# Patient Record
Sex: Male | Born: 1995 | Race: Black or African American | Hispanic: No | Marital: Married | Smoking: Current every day smoker
Health system: Southern US, Community
[De-identification: ages and names within clinical notes are randomized; demographics above are authoritative.]

---

## 2017-11-28 ENCOUNTER — Encounter (HOSPITAL_BASED_OUTPATIENT_CLINIC_OR_DEPARTMENT_OTHER): Payer: Self-pay | Admitting: *Deleted

## 2017-11-28 ENCOUNTER — Emergency Department (HOSPITAL_BASED_OUTPATIENT_CLINIC_OR_DEPARTMENT_OTHER)
Admission: EM | Admit: 2017-11-28 | Discharge: 2017-11-28 | Disposition: A | Discharge status: Home / Self Care | Payer: Self-pay | Attending: Emergency Medicine | Admitting: Emergency Medicine

## 2017-11-28 ENCOUNTER — Emergency Department (HOSPITAL_BASED_OUTPATIENT_CLINIC_OR_DEPARTMENT_OTHER): Payer: Self-pay

## 2017-11-28 ENCOUNTER — Other Ambulatory Visit: Payer: Self-pay

## 2017-11-28 DIAGNOSIS — R112 Nausea with vomiting, unspecified: Secondary | ICD-10-CM

## 2017-11-28 DIAGNOSIS — R21 Rash and other nonspecific skin eruption: Secondary | ICD-10-CM | POA: Insufficient documentation

## 2017-11-28 DIAGNOSIS — R05 Cough: Secondary | ICD-10-CM | POA: Insufficient documentation

## 2017-11-28 DIAGNOSIS — R110 Nausea: Secondary | ICD-10-CM | POA: Insufficient documentation

## 2017-11-28 DIAGNOSIS — R509 Fever, unspecified: Secondary | ICD-10-CM | POA: Insufficient documentation

## 2017-11-28 DIAGNOSIS — F17200 Nicotine dependence, unspecified, uncomplicated: Secondary | ICD-10-CM | POA: Insufficient documentation

## 2017-11-28 LAB — COMPREHENSIVE METABOLIC PANEL
ALT: 35 U/L (ref 17–63)
AST: 29 U/L (ref 15–41)
Albumin: 4.6 g/dL (ref 3.5–5.0)
Alkaline Phosphatase: 78 U/L (ref 38–126)
Anion gap: 10 (ref 5–15)
BILIRUBIN TOTAL: 0.4 mg/dL (ref 0.3–1.2)
BUN: 15 mg/dL (ref 6–20)
CHLORIDE: 102 mmol/L (ref 101–111)
CO2: 25 mmol/L (ref 22–32)
Calcium: 9.4 mg/dL (ref 8.9–10.3)
Creatinine, Ser: 1.06 mg/dL (ref 0.61–1.24)
Glucose, Bld: 95 mg/dL (ref 65–99)
POTASSIUM: 4.1 mmol/L (ref 3.5–5.1)
Sodium: 137 mmol/L (ref 135–145)
TOTAL PROTEIN: 7.9 g/dL (ref 6.5–8.1)

## 2017-11-28 LAB — CBC WITH DIFFERENTIAL/PLATELET
BASOS ABS: 0 10*3/uL (ref 0.0–0.1)
BASOS PCT: 0 %
EOS PCT: 0 %
Eosinophils Absolute: 0 10*3/uL (ref 0.0–0.7)
HEMATOCRIT: 43.6 % (ref 39.0–52.0)
Hemoglobin: 15.2 g/dL (ref 13.0–17.0)
Lymphocytes Relative: 2 %
Lymphs Abs: 0.2 10*3/uL — ABNORMAL LOW (ref 0.7–4.0)
MCH: 29.7 pg (ref 26.0–34.0)
MCHC: 34.9 g/dL (ref 30.0–36.0)
MCV: 85.3 fL (ref 78.0–100.0)
MONO ABS: 0.9 10*3/uL (ref 0.1–1.0)
Monocytes Relative: 10 %
NEUTROS ABS: 7.6 10*3/uL (ref 1.7–7.7)
Neutrophils Relative %: 88 %
PLATELETS: 163 10*3/uL (ref 150–400)
RBC: 5.11 MIL/uL (ref 4.22–5.81)
RDW: 11.7 % (ref 11.5–15.5)
WBC: 8.7 10*3/uL (ref 4.0–10.5)

## 2017-11-28 LAB — INFLUENZA PANEL BY PCR (TYPE A & B)
INFLBPCR: NEGATIVE
Influenza A By PCR: POSITIVE — AB

## 2017-11-28 LAB — LIPASE, BLOOD: LIPASE: 81 U/L — AB (ref 11–51)

## 2017-11-28 MED ORDER — FAMOTIDINE 40 MG PO TABS: 40.0000 mg | ORAL_TABLET | Freq: Every day | ORAL | 0 refills | Status: AC

## 2017-11-28 MED ORDER — KETOROLAC TROMETHAMINE 30 MG/ML IJ SOLN
30.0000 mg | Freq: Once | INTRAMUSCULAR | Status: AC
  Administered 2017-11-28: 30 mg via INTRAVENOUS
  Filled 2017-11-28: qty 1

## 2017-11-28 MED ORDER — SODIUM CHLORIDE 0.9 % IV BOLUS (SEPSIS)
1000.0000 mL | Freq: Once | INTRAVENOUS | Status: AC
  Administered 2017-11-28: 1000 mL via INTRAVENOUS

## 2017-11-28 MED ORDER — ONDANSETRON HCL 4 MG/2ML IJ SOLN
4.0000 mg | Freq: Once | INTRAMUSCULAR | Status: AC
  Administered 2017-11-28: 4 mg via INTRAVENOUS
  Filled 2017-11-28: qty 2

## 2017-11-28 MED ORDER — ACETAMINOPHEN 325 MG PO TABS
650.0000 mg | ORAL_TABLET | Freq: Once | ORAL | Status: AC
  Administered 2017-11-28: 650 mg via ORAL
  Filled 2017-11-28: qty 2

## 2017-11-28 MED ORDER — ONDANSETRON HCL 4 MG PO TABS: 4.0000 mg | ORAL_TABLET | Freq: Three times a day (TID) | ORAL | 0 refills | Status: AC | PRN

## 2017-11-28 NOTE — ED Triage Notes (Signed)
Pt reports cough, sore throat and vomiting "with bright red blood in it" x last night with chills and hot flashes, denies any diarrhea, subjective temps also.

## 2017-11-28 NOTE — Discharge Instructions (Addendum)
Continue to take Tylenol for fever.  We are prescribing you an acid medicine and antinausea medicine to help with the other symptoms.  You should return to the hospital if you have any worsening symptoms.  We are also giving you the number for a medical clinic to see to establish a primary care doctor.

## 2017-11-28 NOTE — ED Provider Notes (Signed)
[facility] EMERGENCY DEPARTMENT Provider Note   CSN: [id number] Arrival date & time: 11/28/17  0800     History   Chief Complaint Chief Complaint  Patient presents with  . Cough    HPI Colton Chavez is a 22 y.o. male.  HPI  22 year old male with no significant past medical history complaining of being sick since yesterday.  He states he started with headache last evening followed by sore throat and then multiple episodes of vomiting which then involve some streaks of red blood in the vomitus.  He now still has some burning chest pain worse with taking a deep breath associated with some cough.  He also noticed a rash across his face after the vomit he was not sure about a fever but did say that he was hot and cold last night and does have a fever on presentation here today.  He did state that 2 years ago he had an episode of intractable vomiting that required him to need to be admitted to the hospital where they treated him with nausea medicine and he needed to stay in the hospital for about 7 days.  He has prior been on antacids but is taking no medications right now.  His daughter was also sick yesterday but he states he was already ill prior to seeing her.  He denies any recent travel or unusual exposures.  History reviewed. No pertinent past medical history.  There are no active problems to display for this patient.   History reviewed. No pertinent surgical history.     Home Medications    Prior to Admission medications   Not on File    Family History History reviewed. No pertinent family history.  Social History Social History   Tobacco Use  . Smoking status: Current Every Day Smoker  . Smokeless tobacco: Never Used  Substance Use Topics  . Alcohol use: Not on file  . Drug use: Not on file     Allergies   Patient has no known allergies.   Review of Systems Review of Systems  Constitutional: Positive for chills and fever.  HENT: Positive for  sore throat.   Eyes: Negative for visual disturbance.  Respiratory: Positive for cough.   Cardiovascular: Positive for chest pain.  Gastrointestinal: Positive for abdominal pain and vomiting. Negative for anal bleeding, constipation and diarrhea.  Genitourinary: Negative for discharge, dysuria and flank pain.  Musculoskeletal: Negative for back pain and myalgias.  Neurological: Positive for headaches. Negative for seizures and numbness.  Hematological: Negative for adenopathy.     Physical Exam Updated Vital Signs BP 130/77 (BP Location: Right Arm)   Pulse 74   Temp (!) 100.4 F (38 C) (Oral)   Resp 18   Ht 5\' 7"  (1.702 m)   Wt 56.7 kg (125 lb)   SpO2 100%   BMI 19.58 kg/m   Physical Exam  Constitutional: He appears well-developed and well-nourished.  HENT:  Head: Normocephalic and atraumatic.  Right Ear: External ear normal.  Left Ear: External ear normal.  Mouth/Throat: Oropharynx is clear and moist. No oropharyngeal exudate.  Eyes: Conjunctivae are normal. Right eye exhibits no discharge. Left eye exhibits no discharge.  Neck: Neck supple.  Cardiovascular: Normal rate and regular rhythm.  No murmur heard. Pulmonary/Chest: Effort normal and breath sounds normal. No stridor. No respiratory distress. He has no wheezes.  Abdominal: Soft. He exhibits no mass. There is no tenderness. There is no rebound and no guarding.  Musculoskeletal: Normal range of motion.  He exhibits no edema.  Lymphadenopathy:    He has no cervical adenopathy.  Neurological: He is alert.  Skin: Skin is warm and dry. Rash (petechial rash around eyes and cheeks) noted.  Psychiatric: He has a normal mood and affect.  Nursing note and vitals reviewed.    ED Treatments / Results  Labs (all labs ordered are listed, but only abnormal results are displayed) Labs Reviewed  LIPASE, BLOOD - Abnormal; Notable for the following components:      Result Value   Lipase 81 (*)    All other components within  normal limits  CBC WITH DIFFERENTIAL/PLATELET - Abnormal; Notable for the following components:   Lymphs Abs 0.2 (*)    All other components within normal limits  INFLUENZA PANEL BY PCR (TYPE A & B) - Abnormal; Notable for the following components:   Influenza A By PCR POSITIVE (*)    All other components within normal limits  COMPREHENSIVE METABOLIC PANEL    EKG  EKG Interpretation None       Radiology Dg Chest 2 View  Result Date: 11/28/2017 CLINICAL DATA:  Cough and fever with vomiting EXAM: CHEST  2 VIEW COMPARISON:  None. FINDINGS: Lungs are clear. Heart size and pulmonary vascularity are normal. No adenopathy. No bone lesions. IMPRESSION: No edema or consolidation. Electronically Signed   By: Bretta BangWilliam  Woodruff III M.D.   On: 11/28/2017 08:45    Procedures Procedures (including critical care time)  Medications Ordered in ED Medications  sodium chloride 0.9 % bolus 1,000 mL (not administered)  ondansetron (ZOFRAN) injection 4 mg (not administered)  acetaminophen (TYLENOL) tablet 650 mg (not administered)     Initial Impression / Assessment and Plan / ED Course  I have reviewed the triage vital signs and the nursing notes.  Pertinent labs & imaging results that were available during my care of the patient were reviewed by me and considered in my medical decision making (see chart for details).  Clinical Course as of Nov 29 1535  Tue Nov 28, 2017  1001 Reevaluated patient.  Pain is improved to 5 out of 10 and he has been tolerating some light.  Try dose of Toradol.  His lab work was benign other than a lipase of 81 and his chest x-ray was negative.  His flu is still pending but will not result during this stay  [MB]    Clinical Course User Index [MB] Terrilee FilesButler, Damariz Paganelli C, MD    This 22 year old healthy male here with a febrile illness involving chest pain abdominal pain sore throat headache.  This possibly a viral syndrome but he also may have experienced Mallory-Weiss  tear previous pneumomediastinum so we need to get a chest x-ray.  He is also getting some basic labs in case there is an intra-abdominal process.  Symptomatically were giving him IV fluids and nausea medicine some Tylenol for his fever.  His medications are in and his lab work and chest x-ray was resulted.  Final Clinical Impressions(s) / ED Diagnoses   Final diagnoses:  Non-intractable vomiting with nausea, unspecified vomiting type  Febrile illness, acute    ED Discharge Orders    None       Terrilee FilesButler, Jerusalem Brownstein C, MD 11/28/17 1537

## 2019-05-10 IMAGING — CR DG CHEST 2V
2 series · 2 of 2 positions shown · non-contrast
Comparison: None.

CLINICAL DATA: Cough and fever with vomiting

EXAM:
CHEST  2 VIEW

[w chest pa]
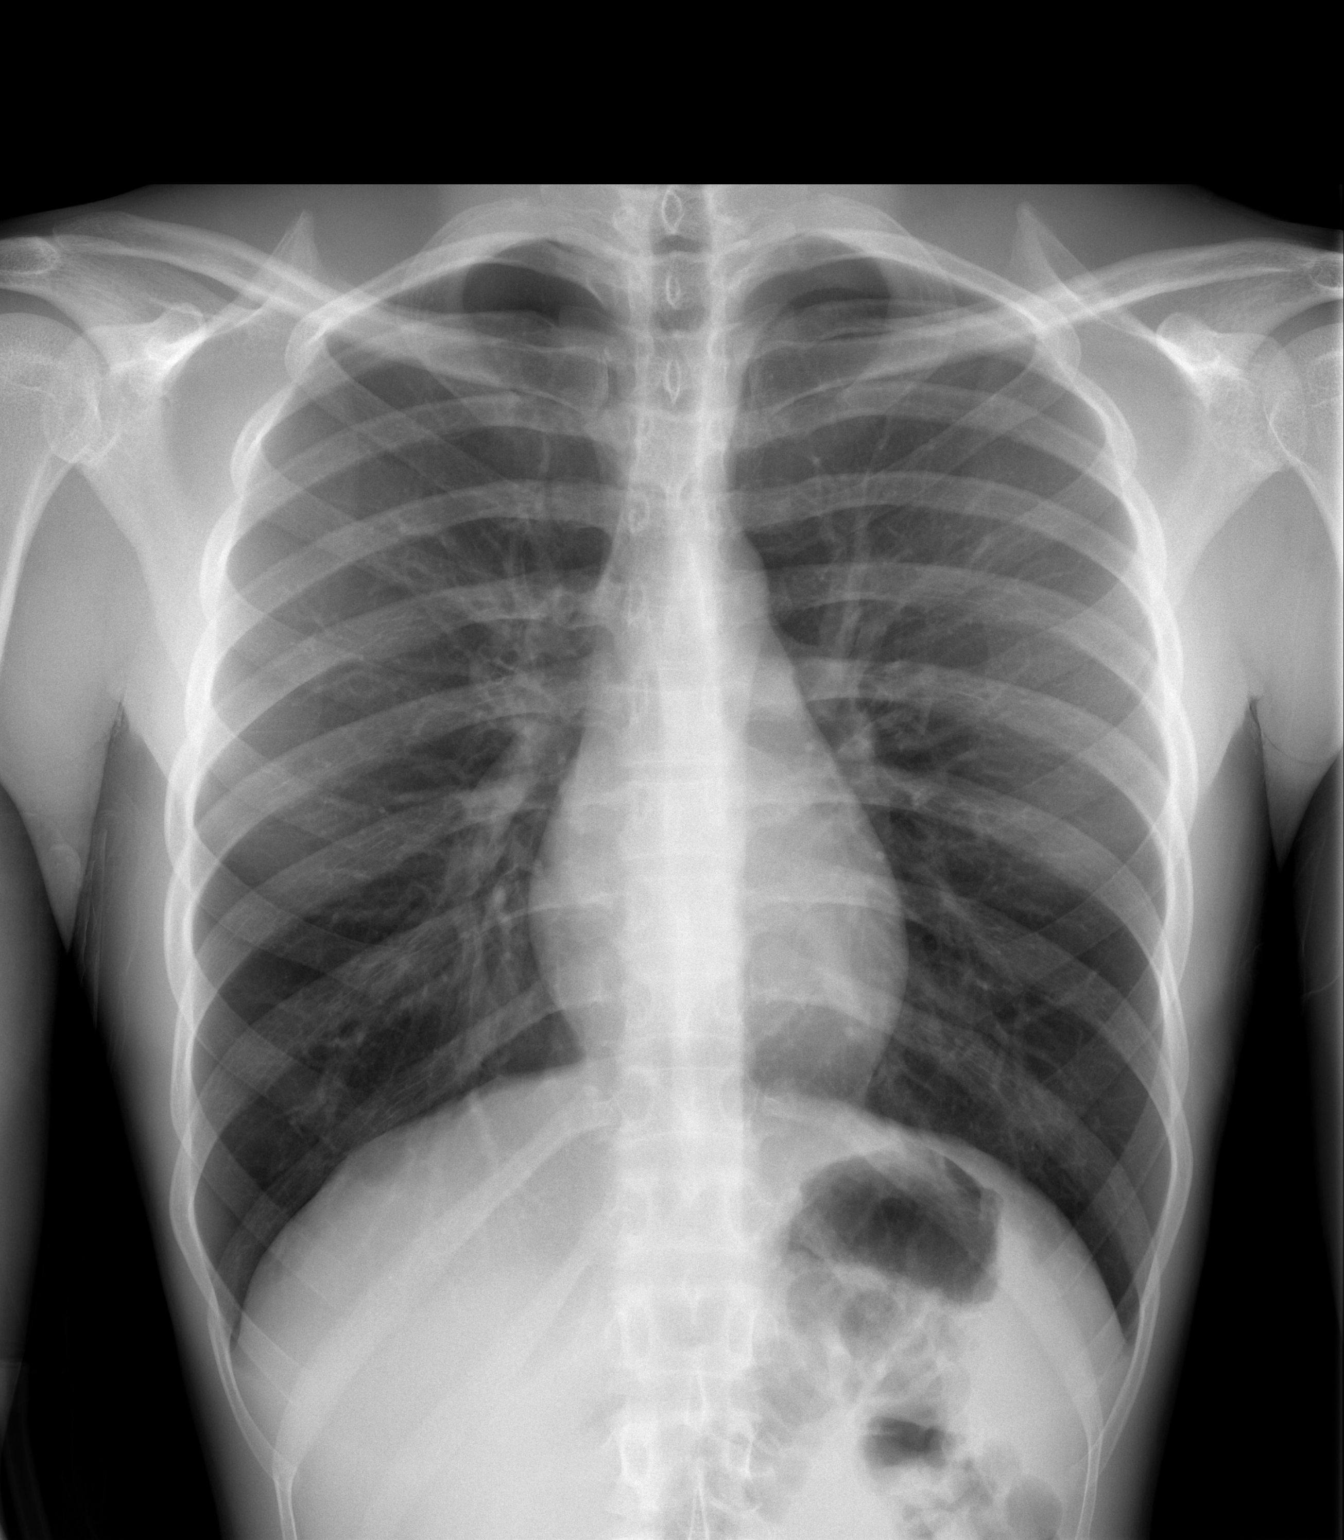

[w chest lat]
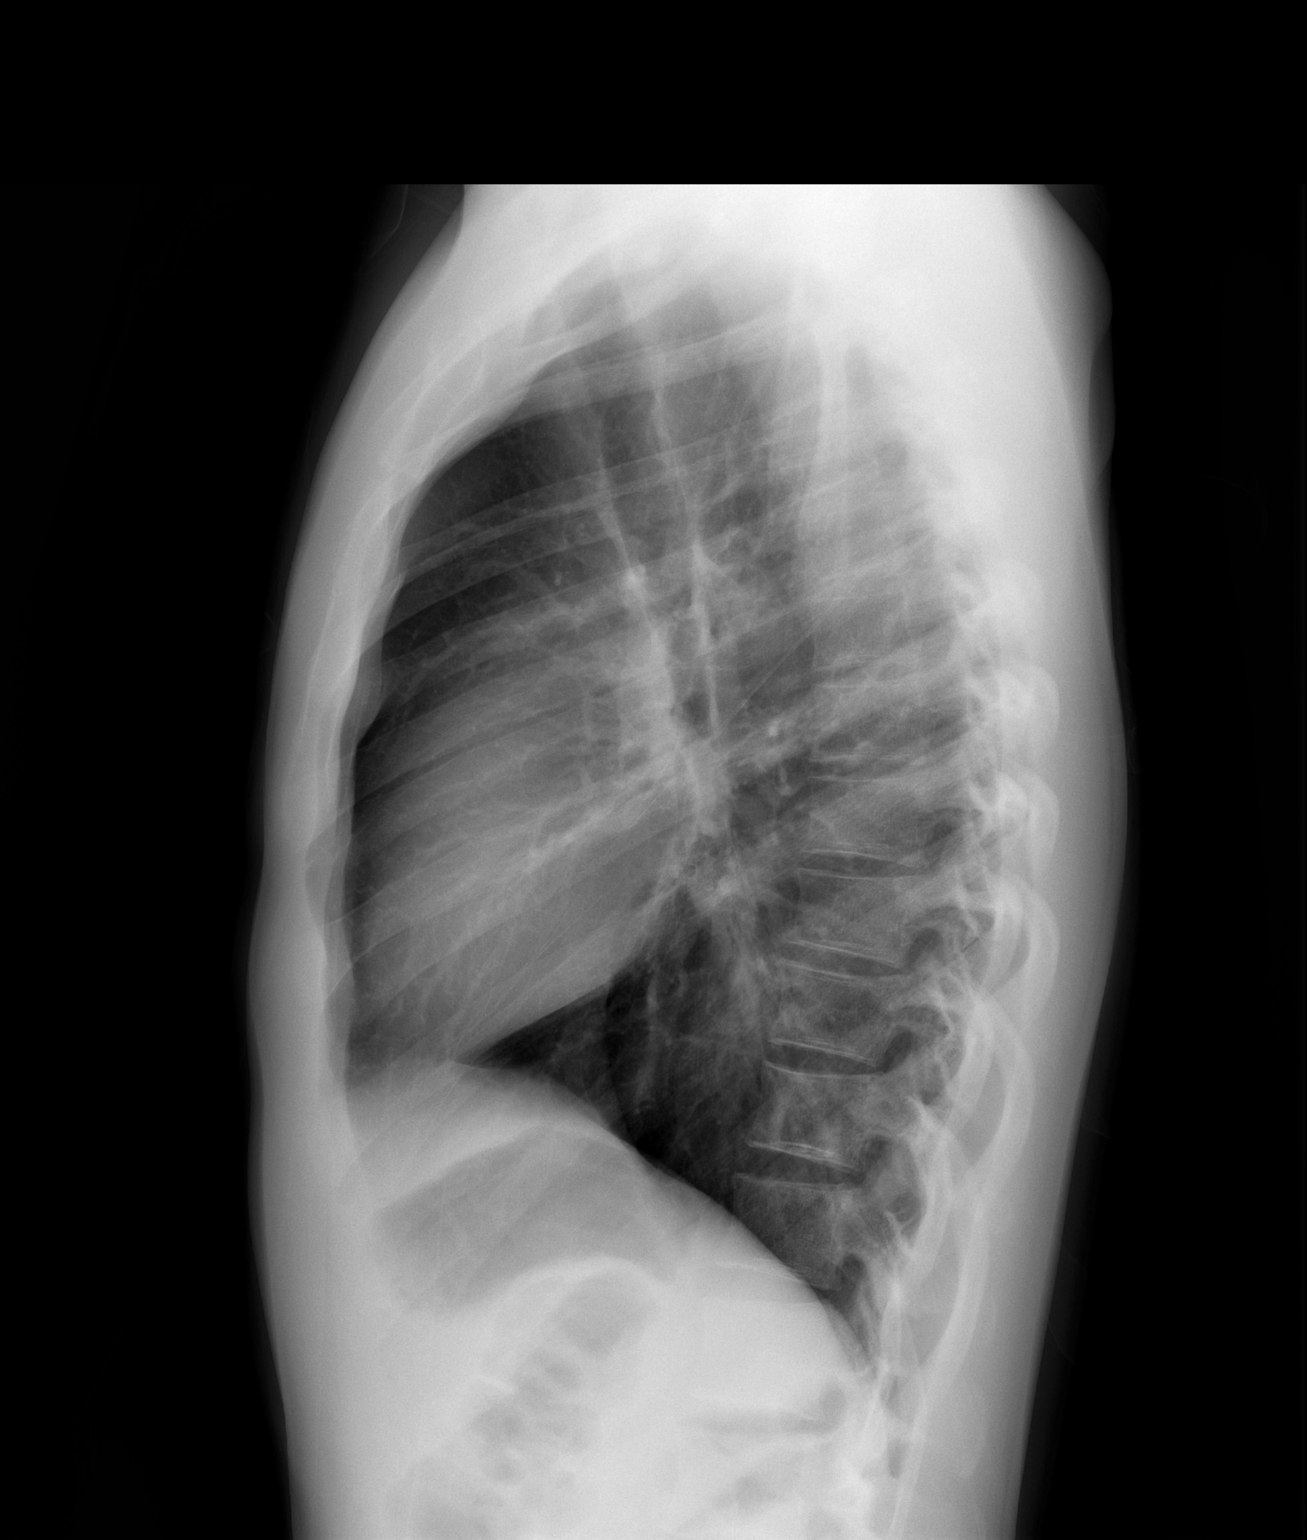

[2 of 2 positions shown; findings below may reference images not displayed]

FINDINGS: Lungs are clear. Heart size and pulmonary vascularity are normal. No
adenopathy. No bone lesions.
IMPRESSION: No edema or consolidation.
